# Patient Record
Sex: Female | Born: 1975
Health system: Southern US, Community
[De-identification: ages and names within clinical notes are randomized; demographics above are authoritative.]

## PROBLEM LIST (undated history)

## (undated) DIAGNOSIS — I10 Essential (primary) hypertension: Secondary | ICD-10-CM

## (undated) DIAGNOSIS — E669 Obesity, unspecified: Secondary | ICD-10-CM

## (undated) HISTORY — DX: Essential (primary) hypertension: I10

## (undated) HISTORY — PX: TONSILLECTOMY AND ADENOIDECTOMY: SHX28

## (undated) HISTORY — DX: Obesity, unspecified: E66.9

---
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
## 1999-07-18 ENCOUNTER — Other Ambulatory Visit: Admission: RE | Admit: 1999-07-18 | Discharge: 1999-07-18 | Payer: Self-pay | Admitting: Obstetrics and Gynecology

## 2000-06-10 ENCOUNTER — Other Ambulatory Visit: Admission: RE | Admit: 2000-06-10 | Discharge: 2000-06-10 | Payer: Self-pay | Admitting: Obstetrics and Gynecology

## 2001-06-19 ENCOUNTER — Other Ambulatory Visit: Admission: RE | Admit: 2001-06-19 | Discharge: 2001-06-19 | Payer: Self-pay | Admitting: Obstetrics and Gynecology

## 2002-06-21 ENCOUNTER — Other Ambulatory Visit: Admission: RE | Admit: 2002-06-21 | Discharge: 2002-06-21 | Payer: Self-pay | Admitting: Obstetrics and Gynecology

## 2003-06-02 ENCOUNTER — Encounter: Admission: RE | Admit: 2003-06-02 | Discharge: 2003-06-02 | Payer: Self-pay | Admitting: Family Medicine

## 2003-06-02 ENCOUNTER — Encounter: Payer: Self-pay | Admitting: Family Medicine

## 2003-07-12 ENCOUNTER — Other Ambulatory Visit: Admission: RE | Admit: 2003-07-12 | Discharge: 2003-07-12 | Payer: Self-pay | Admitting: Obstetrics and Gynecology

## 2004-07-20 ENCOUNTER — Other Ambulatory Visit: Admission: RE | Admit: 2004-07-20 | Discharge: 2004-07-20 | Payer: Self-pay | Admitting: Obstetrics and Gynecology

## 2005-07-22 ENCOUNTER — Other Ambulatory Visit: Admission: RE | Admit: 2005-07-22 | Discharge: 2005-07-22 | Payer: Self-pay | Admitting: Obstetrics and Gynecology

## 2006-07-22 ENCOUNTER — Other Ambulatory Visit: Admission: RE | Admit: 2006-07-22 | Discharge: 2006-07-22 | Payer: Self-pay | Admitting: Obstetrics and Gynecology

## 2006-10-29 ENCOUNTER — Ambulatory Visit (HOSPITAL_BASED_OUTPATIENT_CLINIC_OR_DEPARTMENT_OTHER): Admission: RE | Admit: 2006-10-29 | Discharge: 2006-10-29 | Payer: Self-pay | Admitting: Urology

## 2006-10-29 ENCOUNTER — Encounter (INDEPENDENT_AMBULATORY_CARE_PROVIDER_SITE_OTHER): Payer: Self-pay | Admitting: Specialist

## 2007-08-19 ENCOUNTER — Other Ambulatory Visit: Admission: RE | Admit: 2007-08-19 | Discharge: 2007-08-19 | Payer: Self-pay | Admitting: Obstetrics and Gynecology

## 2008-08-19 ENCOUNTER — Other Ambulatory Visit: Admission: RE | Admit: 2008-08-19 | Discharge: 2008-08-19 | Payer: Self-pay | Admitting: Obstetrics and Gynecology

## 2010-11-22 ENCOUNTER — Encounter
Admission: RE | Admit: 2010-11-22 | Discharge: 2010-12-04 | Payer: Self-pay | Source: Home / Self Care | Attending: Obstetrics and Gynecology | Admitting: Obstetrics and Gynecology

## 2011-01-16 ENCOUNTER — Encounter: Payer: BC Managed Care – PPO | Attending: Obstetrics and Gynecology | Admitting: *Deleted

## 2011-01-16 DIAGNOSIS — Z713 Dietary counseling and surveillance: Secondary | ICD-10-CM | POA: Insufficient documentation

## 2011-01-16 DIAGNOSIS — E669 Obesity, unspecified: Secondary | ICD-10-CM | POA: Insufficient documentation

## 2011-03-19 ENCOUNTER — Encounter: Payer: BC Managed Care – PPO | Attending: Obstetrics and Gynecology | Admitting: *Deleted

## 2011-03-19 DIAGNOSIS — E669 Obesity, unspecified: Secondary | ICD-10-CM | POA: Insufficient documentation

## 2011-03-19 DIAGNOSIS — Z713 Dietary counseling and surveillance: Secondary | ICD-10-CM | POA: Insufficient documentation

## 2011-03-22 NOTE — Op Note (Signed)
Kellie Frazier, Kellie Frazier                  ACCOUNT NO.:  [id number]   MEDICAL RECORD NO.:  [id number]          PATIENT TYPE:  AMB   LOCATION:  [facility]                         FACILITY:  [facility]   PHYSICIAN:  Ronald L. Earlene Plater, M.D.  DATE OF BIRTH:  1975/12/27   DATE OF PROCEDURE:  10/29/2006  DATE OF DISCHARGE:                               OPERATIVE REPORT   DIAGNOSIS:  Possible interstitial cystitis.   OPERATIVE PROCEDURES:  1. Cystourethroscopy.  2. Hydraulic bladder distention.  3. Bladder biopsy.   SURGEON:  Lucrezia Starch. Earlene Plater, M.D.   ANESTHESIA:  LMA.   ESTIMATED BLOOD LOSS:  Negligible.   TUBES:  None.   COMPLICATIONS:  None.   INDICATION FOR PROCEDURE:  Ms. [name] is a lovely 35 year old white  female approximately 3-1/2 months ago developed urgency, frequency and  some urge incontinence.  She notes she has had no neurologic symptoms at  all but notes that these symptoms have progressively worsened.  She does  have slight urinary continence, that is not the way, but mainly it is  the urgency, frequency and bladder pain relieved by voiding.  NMP-22 was  negative, and it is felt that interstitial cystitis is a possibility.  After understanding risks, benefits, and alternatives, she elects to  proceed with cystoscopy, hydraulic bladder distention and bladder biopsy  for diagnostic and therapeutic purposes.   PROCEDURE IN DETAIL:  The patient was placed in supine position, after  proper LMA anesthesia was placed in the dorsal lithotomy position and  prepped and draped with Betadine in sterile fashion.  Cystourethroscopy  was performed with a 22.5 [place] Olympus panendoscope.  The bladder was  carefully inspected and noted to be without lesions.  Efflux of clear  urine was noted from the normally-placed ureteral orifices bilaterally.  Hydraulic bladder distention was then performed to a pressure of 80  cmH2O.  She was noted to have an 1100 mL capacity bladder, and this  freely drained.   Reinspection revealed there were a few glomerulations  on the posterior wall but there were no  cracks or other problems.  A biopsy was obtained from the posterior  midline and submitted to pathology.  The base was cauterized with Bugbee  coagulation cautery.  The bladder was drained, good hemostasis noted to  be present, the endoscope was removed, and the patient was taken to the  recovery room stable.      Ronald L. Earlene Plater, M.D.  Electronically Signed     RLD/MEDQ  D:  10/29/2006  T:  10/29/2006  Job:  [id number]

## 2011-05-20 ENCOUNTER — Encounter: Payer: Self-pay | Admitting: *Deleted

## 2011-05-20 ENCOUNTER — Encounter: Payer: BC Managed Care – PPO | Attending: Obstetrics and Gynecology | Admitting: *Deleted

## 2011-05-20 DIAGNOSIS — E669 Obesity, unspecified: Secondary | ICD-10-CM | POA: Insufficient documentation

## 2011-05-20 DIAGNOSIS — Z713 Dietary counseling and surveillance: Secondary | ICD-10-CM | POA: Insufficient documentation

## 2011-05-20 NOTE — Progress Notes (Signed)
  Medical Nutrition Therapy:  Appt start time:  3:00p  end time:  3:30p.  ASSESSMENT Primary concerns today: Follow-up visit.   Patient doing well, but discouraged about ~4 lb weight gain. Clothes fitting only "a little loose". 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  Reports avoidance of [employer] and Starbucks since starting new job and exercises 2-3 times/week at the gym.  Recall shows irregular CHO intake pattern ranging from very high to very low (if any) CHO at meals and snacks.    MEDICATIONS: none  SUPPLEMENTS:  B12, Calcium, Vit D  DIETARY INTAKE 24-hr recall:  B (4:30 AM): cereal with skim milk or egg and cheese omlette, coffee  Snk (7:30-9:30 AM):  Nuts, water L (12:30 PM):  Out 2x/week or sandwich, nuts, water  Snk (time varies):  Red velvet cake batter yogurt w/ rainbow sprinkles (2x/week), water D (5:30-6 PM): Shrimp salad or shrimp and grits (parent's house)  Snk (8-9 PM): 8oz skim milk  Recent physical activity: Cardio and strength training at gym 2-3x/week  Estimated energy needs: (unchanged) 1500 calories 185-190 g carbohydrates (45g @ meals/15g @ snacks) 85-90 g protein 55-60 g fat  Progress Towards Goal(s):  Some progress.   Nutritional Diagnosis:  NI-5.8.4 Inconsistent carbohydrate intake related to highly varied CHO intake at meals and snacks as evidenced by 24-hour food recall and a 4.5 lb weight gain in 2 months.    Intervention/Updated Goals:  Make sure to get enough carbs and protein at each meal and snack.  Eat a snack after work and continue avoiding fast food restaurants.  Increase exercise to 3-4 days, try to include at least 20 min of cardio each time.  Watch portions at restaurants while on vacation; Choose healthier options.  Follow up with me in 4 weeks.   Monitoring/Evaluation:  Dietary intake, exercise, and body weight in 4  week(s).

## 2011-05-20 NOTE — Patient Instructions (Addendum)
Goals:  Make sure to get enough carbs and protein at each meal and snack.  Eat a snack after work and continue avoiding fast food restaurants.  Increase exercise to 3-4 days, try to include at least 20 min of cardio each time.  Watch portions at restaurants while on vacation; Choose healthier options.  Follow up with me in 4 weeks.

## 2011-06-17 ENCOUNTER — Encounter: Payer: BC Managed Care – PPO | Attending: Obstetrics and Gynecology | Admitting: *Deleted

## 2011-06-17 DIAGNOSIS — E669 Obesity, unspecified: Secondary | ICD-10-CM | POA: Insufficient documentation

## 2011-06-17 DIAGNOSIS — Z713 Dietary counseling and surveillance: Secondary | ICD-10-CM | POA: Insufficient documentation

## 2011-06-17 NOTE — Patient Instructions (Addendum)
Goals:  Continue previous nutrition goals.  Make sure to get enough carbs and protein at each meal and snack.  Eat a snack after work and continue avoiding fast food restaurants.  Resume previous exercise regimen 1-2 days/week - gradually increase to 3-4 days/week; include at least 20 min of cardio each time.  Follow up with me in 8 weeks.

## 2011-06-17 NOTE — Progress Notes (Signed)
  Medical Nutrition Therapy:  Appt start time: 1500 end time:  1530.  Assessment:  Primary concerns today: Obesity Follow up.  On vacation from 23-28th. Mostly cooking, little eating out. Ate chicken, spaghetti, eggs w/ bacon and biscuit (1-2 Grands brand), fruit, watermelon, pretzels. No desserts. Drank water or cheerwine (~1/day).  Also, hasn't exercised for last 3 weeks except lots of steps at the beach.   MEDICATIONS: No changes reported  DIETARY INTAKE  24-hr recall:  NO Starbucks since 05/25/11    B (4:30 AM): eggs or cereal w/ skim milk  Snk (8:30 AM):  8 oz co-worker's trail mix w/ peanuts, cashews, almonds, m&ms  L (12:30 PM): Flour tortilla (4") with [place] and cheese; water    Snk (varies): Rest of trail mix or Wendy's double stack (2) mini burgers (2x/week) or NONE D (5-6 PM): same type of dinners Snk (8-9 PM): 8 oz milk  Recent physical activity: None in last 3 weeks.  Estimated energy needs: (No changes) 1500 calories  185-190 g carbohydrates (45g @ meals/15g @ snacks)  85-90 g protein  55-60 g fat   Progress Towards Goal(s):  Some progress.   Nutritional Diagnosis:  NI-5.8.4 Inconsistent carbohydrate intake related to highly varied CHO intake at meals and snacks as evidenced by 24-hour food recall and a 4.5 lb weight gain in 2 months.    Intervention:   Continue previous nutrition goals. Make sure to get enough carbs and protein at each meal and snack.  Eat a snack after work and continue avoiding fast food restaurants.  Resume previous exercise regimen 1-2 days/week - gradually increase to 3-4 days/week; include at least 20 min of cardio each time.  Follow up with me in 4 weeks.   Monitoring/Evaluation:  Dietary intake, exercise, and body weight in 2 month(s).

## 2011-08-19 ENCOUNTER — Encounter: Payer: 59 | Attending: Obstetrics and Gynecology | Admitting: *Deleted

## 2011-08-19 ENCOUNTER — Encounter: Payer: Self-pay | Admitting: *Deleted

## 2011-08-19 DIAGNOSIS — Z713 Dietary counseling and surveillance: Secondary | ICD-10-CM | POA: Insufficient documentation

## 2011-08-19 DIAGNOSIS — E669 Obesity, unspecified: Secondary | ICD-10-CM | POA: Insufficient documentation

## 2011-08-19 NOTE — Progress Notes (Signed)
  Medical Nutrition Therapy:  Appt start time: 1500 end time:  1530.  Assessment:  Primary concerns today: Obesity Follow up.  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  Reports eating out a lot more; "I'm letting stress dictate what I eat".  Increased emotional and stress eating. Thinking about starting the "Fat Smash" - 9 days: detox - unltd veggies/fruit, no red meat, herbal tea.   MEDICATIONS: No changes reported  24-hr recall:  Back on starbucks - cinnamon dolce latte; but venti with reg milk and whip cream    B (4:30 AM): eggs or cereal w/ skim milk  Snk (8:30 AM):  8 oz trail mix w/ peanuts, cashews, almonds, m&ms  L (12:30 PM): Subway - 12" tuscan chicken melt plain w/ cheese and non-starchy veggies; Sprite 16 oz or water   Snk: None D (5-6 PM): at parent's house - getting seconds Snk (8-9 PM): 8 oz glass milk  Recent physical activity: Gym 2-3 days/wk - inconsistent lately.  Estimated energy needs: (No changes) 1500 calories  185-190 g carbohydrates (45g @ meals/15g @ snacks)  85-90 g protein  55-60 g fat  Progress Towards Goal(s):  No progress.   Nutritional Diagnosis:  NI-5.8.4 Inconsistent carbohydrate intake related to highly varied CHO intake at meals and snacks as evidenced by 24-hour food recall and a 4.5 lb weight gain in 2 months.    Intervention:   Continue previous nutrition goals. Add daily MVI - try the gummies.  Continue to aim for 3 days/week at the gym for 30-60 min each visit. If you try the Fat Smash diet, keep in contact with me so I can monitor.  Call or email me with ANY questions or while at the pizza parlor.    Monitoring/Evaluation:  Dietary intake, exercise, and body weight in 2 month(s).

## 2011-08-19 NOTE — Patient Instructions (Addendum)
Goals:  Continue previous nutrition goals. Add daily MVI - try the gummies.  Continue to aim for 3 days/week at the gym for 30-60 min each visit. If you try the Fat Smash diet, keep in contact with me so I can monitor.  Call or email me with ANY questions or while at the pizza parlor.

## 2011-10-14 ENCOUNTER — Ambulatory Visit: Payer: BC Managed Care – PPO | Admitting: *Deleted

## 2011-10-22 ENCOUNTER — Encounter: Payer: Self-pay | Admitting: *Deleted

## 2011-10-22 ENCOUNTER — Encounter: Payer: 59 | Attending: Obstetrics and Gynecology | Admitting: *Deleted

## 2011-10-22 DIAGNOSIS — Z713 Dietary counseling and surveillance: Secondary | ICD-10-CM | POA: Insufficient documentation

## 2011-10-22 DIAGNOSIS — E669 Obesity, unspecified: Secondary | ICD-10-CM | POA: Insufficient documentation

## 2011-10-22 NOTE — Progress Notes (Signed)
  Medical Nutrition Therapy:  Appt start time: 1545 end time:  1615.  Assessment:  Primary concerns today: Obesity Follow up.  Pt doing much better now that school is done for the semester (12/5). Never started the Fat Smash diet d/t time constraints.  Since 12/5, eating a little better, but still eating convenience foods d/t lack of finances. Sticking with what works.    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  Reports eating out a lot more; "I'm letting stress dictate what I eat".  Increased emotional and stress eating. Thinking about starting the "Fat Smash" - 9 days: detox - unltd veggies/fruit, no red meat, herbal tea.   MEDICATIONS: No changes reported  24-hr recall:  Only drinking caramel brulee latte every other weekend; venti with reg milk and whip cream    B (4:30 AM):  Cereal in a bag  Snk (8:30 AM):  none  L (12:30 PM): [place] on sandwich round; water   Snk: None D (5-6 PM): 1 helping of each food at parent's house - no seconds now Snk (8-9 PM): none *Drinks ~ 85-90 oz water daily.  Recent physical activity: None last month d/t end of semester/work responsibilities.  Plans to resume gym 2-3x/wk asap.   Estimated energy needs: (No changes) 1500 calories  185-190 g carbohydrates (45g @ meals/15g @ snacks)  85-90 g protein  55-60 g fat  Progress Towards Goal(s):  No progress.   Nutritional Diagnosis:  NI-5.8.4 Inconsistent carbohydrate intake related to highly varied CHO intake at meals and snacks as evidenced by 24-hour food recall and a 4.5 lb weight gain in 2 months.    Intervention:   Continue previous nutrition goals.  Continue to aim for 3 days/week at the gym for 30-60 min each visit. When at [facility], choose smaller size and try with skim milk.   Monitoring/Evaluation:  Dietary intake, exercise, and body weight in 2 month(s).

## 2011-10-22 NOTE — Patient Instructions (Addendum)
Goals:  Continue previous nutrition goals.  Continue to aim for 3 days/week at the gym for 30-60 min each visit. When at [facility], choose smaller size and try with skim milk.   Estimated energy needs:  1500 calories  185-190 g carbohydrates (45g @ meals/15g @ snacks)  85-90 g protein  55-60 g fat

## 2011-10-23 ENCOUNTER — Encounter: Payer: Self-pay | Admitting: *Deleted

## 2011-12-03 ENCOUNTER — Encounter: Payer: 59 | Attending: Obstetrics and Gynecology | Admitting: *Deleted

## 2011-12-03 ENCOUNTER — Encounter: Payer: Self-pay | Admitting: *Deleted

## 2011-12-03 DIAGNOSIS — Z713 Dietary counseling and surveillance: Secondary | ICD-10-CM | POA: Insufficient documentation

## 2011-12-03 DIAGNOSIS — E669 Obesity, unspecified: Secondary | ICD-10-CM | POA: Insufficient documentation

## 2011-12-03 NOTE — Progress Notes (Signed)
  Medical Nutrition Therapy:  Appt start time: 1545 end time:  1615.  Primary concerns today: Obesity Follow up.  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 No significant changes in dietary habits, though now only drinks grande cinnamon latte every (or every other) weekend w/ reg milk and whip cream plus 1 daily snack. Continues to consume excessive CHO at times. Pt has resumed exercising 2 days/week and plans to increase. No other changes noted. Will track body composition each visit.  TANITA BODY COMPOSITION RESULTS: %Fat:  47.5% FM:  113.5 lbs FFM:  125.0 lbs TBW:  91.5 lbs (on menstrual cycle)  MEDICATIONS: No changes reported  24-hr recall:  Drinks cinnamon latte every (or other) weekend; [place] now (was [employer]) w/ reg milk and whip cream   B (4:30 AM):  2 scrambled eggs, whole wheat bagel (med) w/ cream cheese; water Snk (8:30 AM):  Snackwells yogurt covered pretzels 100 cal pack or Zone perfect strawberry bar; water L (12:30 PM): [place] on sandwich round; water   Snk: None D (5-6 PM): Chili (homemade) w/ sour cream, Fritos scoops; water Snk (8-9 PM): none *Drinks ~ 85-90 oz water daily.  Recent physical activity: Doing elliptical or treadmill 2x/wk @ 20-30 min; recently added weights.    Estimated energy needs: (No changes) 1500 calories  185-190 g carbohydrates (45g @ meals/15g @ snacks)  85-90 g protein  55-60 g fat  Progress Towards Goal(s):  Some progress; Continue.   Nutritional Diagnosis:  NI-5.8.4 Inconsistent carbohydrate intake related to highly varied CHO intake at meals and snacks as evidenced by 24-hour food recall and a 4.5 lb weight gain in 2 months.    Intervention:   Continue previous nutrition goals.  Continue to aim for 3-4 days/week at the gym for 30-60 min each visit. When at [facility], choose smaller size and try with skim milk.   Monitoring/Evaluation:  Dietary intake, exercise, and body weight in 6 weeks(s).

## 2011-12-03 NOTE — Patient Instructions (Signed)
Goals:  Continue previous nutrition goals.  Continue to aim for 3-4 days/week at the gym for 30-60 min each visit. When at [facility], choose smaller size and try with skim milk.   Estimated energy needs:  1500 calories  185-190 g carbohydrates (45g @ meals/15g @ snacks)  85-90 g protein  55-60 g fat

## 2012-01-14 ENCOUNTER — Encounter: Payer: 59 | Attending: Obstetrics and Gynecology | Admitting: *Deleted

## 2012-01-14 ENCOUNTER — Encounter: Payer: Self-pay | Admitting: *Deleted

## 2012-01-14 DIAGNOSIS — Z713 Dietary counseling and surveillance: Secondary | ICD-10-CM | POA: Insufficient documentation

## 2012-01-14 DIAGNOSIS — E669 Obesity, unspecified: Secondary | ICD-10-CM | POA: Insufficient documentation

## 2012-01-14 NOTE — Patient Instructions (Addendum)
Goals:  Continue previous nutrition goals.  Resume physical activity. Work up to 3-4 days/week at the gym for 30-60 min each visit.   - Have small snack before exercising (or try Calorie Smart - 1/2 before and 1/2 after)  Continue to avoid/limit sweets and sweetened drinks  TANITA  BODY COMP RESULTS  12/03/11 01/14/12  %Fat 47.5% 46.6%  FM (lbs) 113.5 111.0  FFM (lbs) 125.0 127.0  TBW (lbs) 91.5 93.0   Estimated energy needs:  1500 calories  185-190 g carbohydrates (45g @ meals/15g @ snacks)  85-90 g protein  55-60 g fat

## 2012-01-14 NOTE — Progress Notes (Signed)
  Medical Nutrition Therapy:  Appt start time: 1515 end time:  1545.  Primary concerns today: Obesity, Follow up.  Reports nausea with sweets recently, so stopped 1.5 weeks ago. Reports watching herself on a camera at work and "not liking it at all".  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  Has lost ~1% body fat and 2.5 lbs FM; has gained 2 lbs FFM.   TANITA  BODY COMP RESULTS  12/03/11 01/14/12    %Fat 47.5% 46.6%    FM (lbs) 113.5 111.0    FFM (lbs) 125.0 127.0    TBW (lbs) 91.5 93.0     MEDICATIONS: No changes reported  Recent physical activity: Backed off for several weeks d/t studying for licensing exam. Slowly resuming; not much at this point.    Estimated energy needs: (No changes) 1500 calories  185-190 g carbohydrates (45g @ meals/15g @ snacks)  85-90 g protein  55-60 g fat  Progress Towards Goal(s):  Some progress; Continue.   Nutritional Diagnosis:  NI-5.8.4 Inconsistent carbohydrate intake related to highly varied CHO intake at meals and snacks as evidenced by 24-hour food recall and a 4.5 lb weight gain in 2 months.    Intervention:   Continue previous nutrition goals.  Resume physical activity. Work up to 3-4 days/week at the gym for 30-60 min each visit.   - Have small snack before exercising (or try Calorie Smart - 1/2 before and 1/2 after)  Continue to avoid/limit sweets and sweetened drinks.   Monitoring/Evaluation:  Dietary intake, exercise, and body weight in 6 weeks(s).  Samples Dispersed:   Boost Glucose Control: 3 bottles Lot #: [id number]; Exp: 10/05/12

## 2012-02-26 ENCOUNTER — Encounter: Payer: Self-pay | Admitting: *Deleted

## 2012-02-26 ENCOUNTER — Encounter: Payer: 59 | Attending: Obstetrics and Gynecology | Admitting: *Deleted

## 2012-02-26 ENCOUNTER — Ambulatory Visit: Payer: 59 | Admitting: *Deleted

## 2012-02-26 DIAGNOSIS — E669 Obesity, unspecified: Secondary | ICD-10-CM | POA: Insufficient documentation

## 2012-02-26 DIAGNOSIS — Z713 Dietary counseling and surveillance: Secondary | ICD-10-CM | POA: Insufficient documentation

## 2012-02-26 NOTE — Progress Notes (Signed)
  Medical Nutrition Therapy:  Appt start time: 800   End time:  830.  Primary concerns today: Obesity, Follow up.  Kellie Frazier is here today for f/u with a weight loss of 3 lbs. Reports increase in exercise. Plans to do yoga class this weekend and wants to do water aerobics. Still consuming excessive CHO at times, though continues to stay away from sweets and now fast food biscuits. No pain reported outside of soreness from Monday workout.  TANITA  BODY COMP RESULTS  12/03/11 01/14/12 02/26/12   %Fat 47.5% 46.6% 46.7%   FM (lbs) 113.5 111.0 110.5   FFM (lbs) 125.0 127.0 126.0   TBW (lbs) 91.5 93.0 92.0    MEDICATIONS: No changes reported  Recent physical activity: Joined the [employer] on 02/24/12 and working with a Systems analyst. Plans to go 3 days a week and gradually increase.   24-Hour Dietary Recall B: Boost Glucose Control OR eggs, 3 pcs [place] bacon, waffle w/ syrup  S: None OR peanut butter crackers L: Dinner leftovers S: None D: Chicken, blk beans, corn, rice casserole (2 c)  Estimated energy needs: (No changes) 1500 calories  185-190 g carbohydrates (45g @ meals/15g @ snacks)  85-90 g protein  55-60 g fat  Progress Towards Goal(s):  Some progress; Continue.   Nutritional Diagnosis:  NI-5.8.4 Inconsistent carbohydrate intake related to highly varied CHO intake at meals and snacks as evidenced by 24-hour food recall and a 4.5 lb weight gain in 2 months.    Intervention:   Continue previous nutrition goals.  Resume physical activity. Work up to 3-4 days/week at the gym for 30-60 min each visit.   - Have small snack before exercising (or try Calorie Smart - 1/2 before and 1/2 after)  Continue to avoid/limit sweets and sweetened drinks.   Samples Dispensed:   Celebrate B12: 1 ea  Kellie Frazier - Lot # [id number]; Exp: 07/14)  Bariatric Advantage B12: 2 ea  (Peppermint - Lot # [id number]; Exp: 05/13)  Monitoring/Evaluation:  Dietary intake, exercise, and body weight in 6  weeks(s).

## 2012-02-26 NOTE — Patient Instructions (Addendum)
Goals:  Continue previous nutrition goals.  Resume physical activity. Work up to 3-4 days/week at the gym for 30-60 min each visit.   - Have small snack before exercising (or try Calorie Smart - 1/2 before and 1/2 after)  Continue to avoid/limit sweets and sweetened drinks   TANITA  BODY COMP RESULTS   12/03/11 01/14/12 02/26/12  %Fat  47.5%  46.6%  46.7%  FM (lbs) 113.5  111.0  110.5  FFM (lbs) 125.0  127.0  126.0  TBW (lbs) 91.5  93.0  92.0   Estimated energy needs:  1500 calories  185-190 g carbohydrates (45g @ meals/15g @ snacks)  85-90 g protein  55-60 g fat

## 2012-04-08 ENCOUNTER — Encounter: Payer: 59 | Attending: Obstetrics and Gynecology | Admitting: *Deleted

## 2012-04-08 ENCOUNTER — Encounter: Payer: Self-pay | Admitting: *Deleted

## 2012-04-08 DIAGNOSIS — E669 Obesity, unspecified: Secondary | ICD-10-CM | POA: Insufficient documentation

## 2012-04-08 DIAGNOSIS — Z713 Dietary counseling and surveillance: Secondary | ICD-10-CM | POA: Insufficient documentation

## 2012-04-08 NOTE — Progress Notes (Addendum)
  Medical Nutrition Therapy:  Appt start time: 1500   End time:  1530.  Primary concerns today: Obesity, Follow-up.  Highly stressful at work and still in school (ends 10/2012). Reports less exercise lately d/t to this. Has been going to the gym inconsistently - based on the day. Has returned to fast food, but continues to stay away from concentrated sweets (except soda).   TANITA  BODY COMP RESULTS  12/03/11 01/14/12 02/26/12 04/08/12  FM (lbs) 113.5 111.0 110.5 110.5  FFM (lbs) 125.0 127.0 126.0 123.0  TBW (lbs) 91.5 93.0 92.0 90.0   MEDICATIONS: No changes reported  Recent physical activity:  GYM = Kickboxing for 60 min; Averages 2 times/week. Plans to attend more classes as they were recently offered.   24-Hour Dietary Recall B: Boost Glucose Control, Frosted Mini Wheats w/ blueberries (no milk)  S: Continued cereal L: Cheese Pizza @ work (3 pcs) OR Spicy Chicken Sandwich (Wendy's or Chick-fil-A), med waffle fries, Cheerwine or Sprite (med- 24 oz) -- DOES THIS 2-3 TIMES/WEEK S: None D: Pork tenderloin, steamed veggies, 3/4 of a Grands biscuit S: Sometimes has milk before bed  Estimated energy needs: (No changes) 1500 calories  185-190 g carbohydrates (45g @ meals/15g @ snacks)  85-90 g protein  55-60 g fat  Progress Towards Goal(s):  Some progress; Continue.   Nutritional Diagnosis:  NI-5.8.4 Inconsistent carbohydrate intake related to highly varied CHO intake at meals and snacks as evidenced by 24-hour food recall and a 4.5 lb weight gain in 2 months.    Intervention:  Continue previous nutrition goals.  Resume physical activity. Work up to 3-4 days/week at the gym for 30-60 min each visit.   - Try Unjury chocolate protein powder as a drink Avoid/limit sweets and sweetened drinks STOP EATING FAST FOOD and TAKE YOUR LUNCH TO WORK! :)  Samples dispensed at visit include:   Unjury Protein Powder (Choc Splendor): 2 pkts Lot # [id number]; Exp: 08/14  Monitoring/Evaluation:   Dietary intake, exercise, and body weight in 6 weeks(s).

## 2012-04-08 NOTE — Patient Instructions (Addendum)
Goals:  Continue previous nutrition goals.  Resume physical activity. Work up to 3-4 days/week at the gym for 30-60 min each visit.   - Try Unjury chocolate protein powder as a drink Avoid/limit sweets and sweetened drinks STOP EATING FAST FOOD and TAKE YOUR LUNCH TO WORK! :)

## 2012-05-20 ENCOUNTER — Ambulatory Visit: Payer: 59 | Admitting: *Deleted

## 2012-09-18 LAB — HM PAP SMEAR: HM Pap smear: NEGATIVE

## 2013-09-24 ENCOUNTER — Encounter: Payer: Self-pay | Admitting: Obstetrics and Gynecology

## 2013-09-24 ENCOUNTER — Ambulatory Visit: Payer: Self-pay | Admitting: Obstetrics and Gynecology

## 2013-09-24 ENCOUNTER — Ambulatory Visit (INDEPENDENT_AMBULATORY_CARE_PROVIDER_SITE_OTHER): Payer: BC Managed Care – PPO | Admitting: Obstetrics and Gynecology

## 2013-09-24 VITALS — BP 142/90 | HR 78 | Resp 16 | Ht 65.5 in | Wt 239.0 lb

## 2013-09-24 DIAGNOSIS — Z01419 Encounter for gynecological examination (general) (routine) without abnormal findings: Secondary | ICD-10-CM

## 2013-09-24 DIAGNOSIS — Z0000 Encounter for general adult medical examination without abnormal findings: Secondary | ICD-10-CM

## 2013-09-24 LAB — POCT URINALYSIS DIPSTICK
Bilirubin, UA: NEGATIVE
Blood, UA: NEGATIVE
Glucose, UA: NEGATIVE
Ketones, UA: NEGATIVE
Leukocytes, UA: NEGATIVE
Nitrite, UA: NEGATIVE
Protein, UA: NEGATIVE
Urobilinogen, UA: NEGATIVE
pH, UA: 6

## 2013-09-24 NOTE — Progress Notes (Signed)
GYNECOLOGY VISIT  PCP: Dr. Barton Fanny  Referring provider:   HPI: 37 y.o.   Single Caucasian  female   [id number] with Patient's last menstrual period was 09/01/2013.   here for   Annual Exam Menses regular. No heavy bleeding. Uses Aleve for cramping and pressure.  No signficant PMS.  Mother diagnosed with breast cancer in her mid 14s.  Doing well.   Hgb:  PCP Urine:  Neg  GYNECOLOGIC HISTORY: Patient's last menstrual period was 09/01/2013. Sexually active:  no Partner preference: female Contraception:   abstain Menopausal hormone therapy: no DES exposure:  no  Blood transfusions:   no Sexually transmitted diseases:  no  GYN Procedures:  no Mammogram:   never    .          Pap: 09/18/12 neg, HR HPV negative   History of abnormal pap smear:  no   OB History   Grav Para Term Preterm Abortions TAB SAB Ect Mult Living   0 0 0 0 0 0 0 0 0 0        LIFESTYLE: Exercise:   Yes  Cardio at [facility] Once a week            Tobacco: no Alcohol: no Drug use:  no  OTHER HEALTH MAINTENANCE: Tetanus/TDap: 2013 Gardisil: completed in 2007 Influenza:  yes Zostavax: no  Bone density: no,  Heel scan  Yes (normal) Colonoscopy:no  Cholesterol check: PCP  Family History  Problem Relation Age of Onset  . Diabetes Other   . Hypertension Other   . Hypertension Maternal Grandmother   . Hypertension Maternal Grandfather   . Diabetes Paternal Grandmother   . Hypertension Paternal Grandmother   . Diabetes Paternal Grandfather   . Hypertension Paternal Grandfather   . Hypertension Mother   . Breast cancer Mother     There are no active problems to display for this patient.  Past Medical History  Diagnosis Date  . Obesity (BMI 30-39.9)   . Hypertension     Past Surgical History  Procedure Laterality Date  . Tonsillectomy and adenoidectomy    . Dysplastic mole removal  1998    --arm and leg    ALLERGIES: Amoxicillin and Erythromycin  Current Outpatient Prescriptions   Medication Sig Dispense Refill  . hydrochlorothiazide (MICROZIDE) 12.5 MG capsule Take 12.5 mg by mouth daily.      Marland Kitchen CALCIUM PO Take by mouth daily. Pt not sure of dose.       . Cholecalciferol (VITAMIN D) 1000 UNITS capsule Take 1,000 Units by mouth daily.        . Multiple Vitamin (MULTIVITAMINS PO) Take 2 each by mouth daily.        . vitamin B-12 (CYANOCOBALAMIN) 500 MCG tablet Take 500 mcg by mouth daily.         No current facility-administered medications for this visit.     ROS:  Pertinent items are noted in HPI.  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  PHYSICAL EXAMINATION:    BP 142/90  Pulse 78  Resp 16  Ht 5' 5.5" (1.664 m)  Wt 239 lb (108.41 kg)  BMI 39.15 kg/m2  LMP 09/01/2013   Wt Readings from Last 3 Encounters:  09/24/13 239 lb (108.41 kg)  04/08/12 233 lb 8 oz (105.915 kg)  02/26/12 236 lb 8 oz (107.276 kg)     Ht Readings from Last 3 Encounters:  09/24/13 5' 5.5" (1.664 m)  04/08/12 5' 5.5" (1.664 m)  02/26/12  5' 5.5" (1.664 m)    General appearance: alert, cooperative and appears stated age Head: Normocephalic, without obvious abnormality, atraumatic Neck: no adenopathy, supple, symmetrical, trachea midline and thyroid not enlarged, symmetric, no tenderness/mass/nodules Lungs: clear to auscultation bilaterally Breasts: Inspection negative, No nipple retraction or dimpling, No nipple discharge or bleeding, No axillary or supraclavicular adenopathy, Normal to palpation without dominant masses Heart: regular rate and rhythm Abdomen: obese, soft, non-tender; no masses,  no organomegaly Extremities: extremities normal, atraumatic, no cyanosis or edema Skin: Skin color, texture, turgor normal. No rashes or lesions Lymph nodes: Cervical, supraclavicular, and axillary nodes normal. No abnormal inguinal nodes palpated Neurologic: Grossly normal  Pelvic: External genitalia:  no lesions              Urethra:  normal appearing urethra  with no masses, tenderness or lesions              Bartholins and Skenes: normal                 Vagina: normal appearing vagina with normal color and discharge, no lesions              Cervix: normal appearance              Pap and high risk HPV testing done: no.            Bimanual Exam:  Uterus:  uterus is normal size, shape, consistency and nontender                                      Adnexa: normal adnexa in size, nontender and no masses                                      Rectovaginal: Confirms                                      Anus:  normal sphincter tone, no lesions  ASSESSMENT  Normal gynecologic exam.  PLAN  Mammogram age 75.  Pap smear and high risk HPV testing in 2018. Return annually or prn   An After Visit Summary was printed and given to the patient.

## 2013-09-24 NOTE — Patient Instructions (Signed)

## 2014-06-30 ENCOUNTER — Encounter: Payer: Self-pay | Admitting: Obstetrics and Gynecology

## 2014-10-03 ENCOUNTER — Ambulatory Visit: Payer: BC Managed Care – PPO | Admitting: Obstetrics and Gynecology

## 2014-10-05 ENCOUNTER — Ambulatory Visit: Payer: BC Managed Care – PPO | Admitting: Obstetrics and Gynecology

## 2014-10-17 ENCOUNTER — Telehealth: Payer: Self-pay | Admitting: Obstetrics and Gynecology

## 2014-10-17 NOTE — Telephone Encounter (Signed)
Left message to call back regarding records release. All for fee or last 2 visits for free?

## 2014-10-18 NOTE — Telephone Encounter (Signed)
Most recent visits faxed per pt request.

## 2014-10-18 NOTE — Telephone Encounter (Signed)
Pt agrees to last two visits.  bf

## 2016-10-14 ENCOUNTER — Other Ambulatory Visit: Payer: Self-pay | Admitting: Obstetrics & Gynecology

## 2016-10-14 ENCOUNTER — Other Ambulatory Visit (HOSPITAL_COMMUNITY)
Admission: RE | Admit: 2016-10-14 | Discharge: 2016-10-14 | Disposition: A | Discharge status: Home / Self Care | Payer: 59 | Source: Ambulatory Visit | Attending: Obstetrics & Gynecology | Admitting: Obstetrics & Gynecology

## 2016-10-14 DIAGNOSIS — Z01419 Encounter for gynecological examination (general) (routine) without abnormal findings: Secondary | ICD-10-CM | POA: Insufficient documentation

## 2016-10-14 DIAGNOSIS — Z1151 Encounter for screening for human papillomavirus (HPV): Secondary | ICD-10-CM | POA: Insufficient documentation

## 2016-10-17 LAB — CYTOLOGY - PAP
Diagnosis: NEGATIVE
HPV: NOT DETECTED

## 2018-10-19 ENCOUNTER — Other Ambulatory Visit: Payer: Self-pay | Admitting: Obstetrics & Gynecology

## 2018-10-19 DIAGNOSIS — Z1231 Encounter for screening mammogram for malignant neoplasm of breast: Secondary | ICD-10-CM

## 2018-10-26 ENCOUNTER — Ambulatory Visit: Payer: 59

## 2018-11-26 ENCOUNTER — Ambulatory Visit
Admission: RE | Admit: 2018-11-26 | Discharge: 2018-11-26 | Disposition: A | Discharge status: Home / Self Care | Payer: BLUE CROSS/BLUE SHIELD | Source: Ambulatory Visit | Attending: Obstetrics & Gynecology | Admitting: Obstetrics & Gynecology

## 2018-11-26 DIAGNOSIS — Z1231 Encounter for screening mammogram for malignant neoplasm of breast: Secondary | ICD-10-CM

## 2018-11-27 ENCOUNTER — Other Ambulatory Visit: Payer: Self-pay | Admitting: Obstetrics & Gynecology

## 2018-11-27 DIAGNOSIS — R928 Other abnormal and inconclusive findings on diagnostic imaging of breast: Secondary | ICD-10-CM

## 2018-12-04 ENCOUNTER — Ambulatory Visit
Admission: RE | Admit: 2018-12-04 | Discharge: 2018-12-04 | Disposition: A | Discharge status: Home / Self Care | Payer: BLUE CROSS/BLUE SHIELD | Source: Ambulatory Visit | Attending: Obstetrics & Gynecology | Admitting: Obstetrics & Gynecology

## 2018-12-04 ENCOUNTER — Other Ambulatory Visit: Payer: Self-pay | Admitting: Obstetrics & Gynecology

## 2018-12-04 DIAGNOSIS — R928 Other abnormal and inconclusive findings on diagnostic imaging of breast: Secondary | ICD-10-CM

## 2018-12-04 DIAGNOSIS — N6489 Other specified disorders of breast: Secondary | ICD-10-CM

## 2019-06-07 ENCOUNTER — Other Ambulatory Visit: Payer: Self-pay | Admitting: Obstetrics & Gynecology

## 2019-06-07 ENCOUNTER — Other Ambulatory Visit: Payer: Self-pay

## 2019-06-07 ENCOUNTER — Ambulatory Visit
Admission: RE | Admit: 2019-06-07 | Discharge: 2019-06-07 | Disposition: A | Discharge status: Home / Self Care | Payer: BC Managed Care – PPO | Source: Ambulatory Visit | Attending: Obstetrics & Gynecology | Admitting: Obstetrics & Gynecology

## 2019-06-07 DIAGNOSIS — N6489 Other specified disorders of breast: Secondary | ICD-10-CM

## 2019-12-10 ENCOUNTER — Other Ambulatory Visit: Payer: Self-pay

## 2019-12-10 ENCOUNTER — Ambulatory Visit: Payer: BC Managed Care – PPO

## 2019-12-10 ENCOUNTER — Ambulatory Visit
Admission: RE | Admit: 2019-12-10 | Discharge: 2019-12-10 | Disposition: A | Discharge status: Home / Self Care | Payer: Managed Care, Other (non HMO) | Source: Ambulatory Visit | Attending: Obstetrics & Gynecology | Admitting: Obstetrics & Gynecology

## 2019-12-10 DIAGNOSIS — N6489 Other specified disorders of breast: Secondary | ICD-10-CM

## 2021-06-14 IMAGING — MG DIGITAL DIAGNOSTIC BILAT W/ TOMO W/ CAD
8 series · 8 of 24 positions shown · non-contrast
Comparison: Previous exam(s).

CLINICAL DATA: 43-year-old female presenting for follow-up of a
likely benign left breast asymmetry.

EXAM:
DIGITAL DIAGNOSTIC BILATERAL MAMMOGRAM WITH CAD AND TOMO

[L MLO synth-2D]
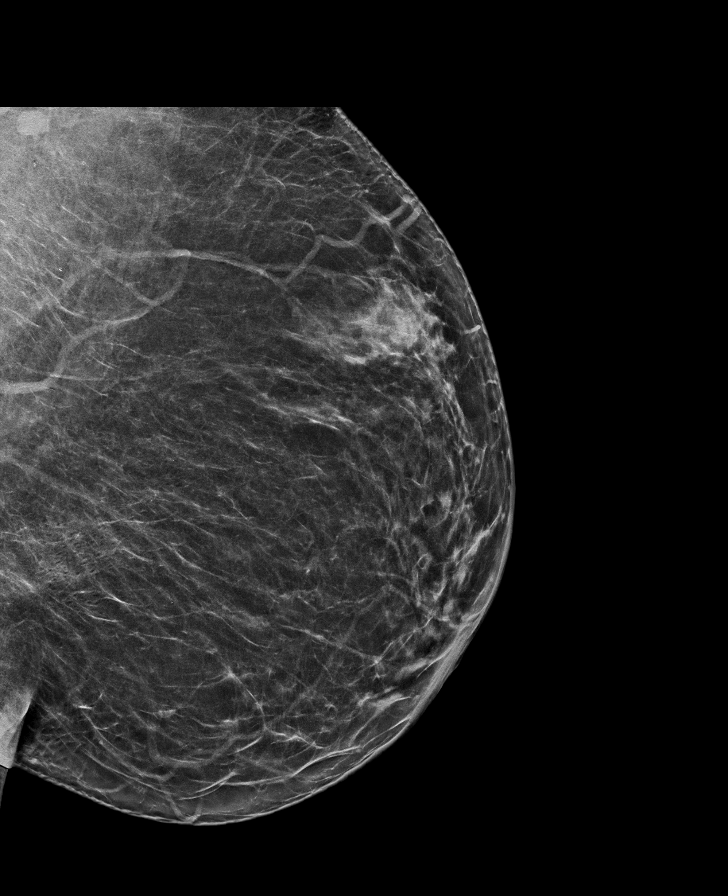

[R MLO synth-2D]
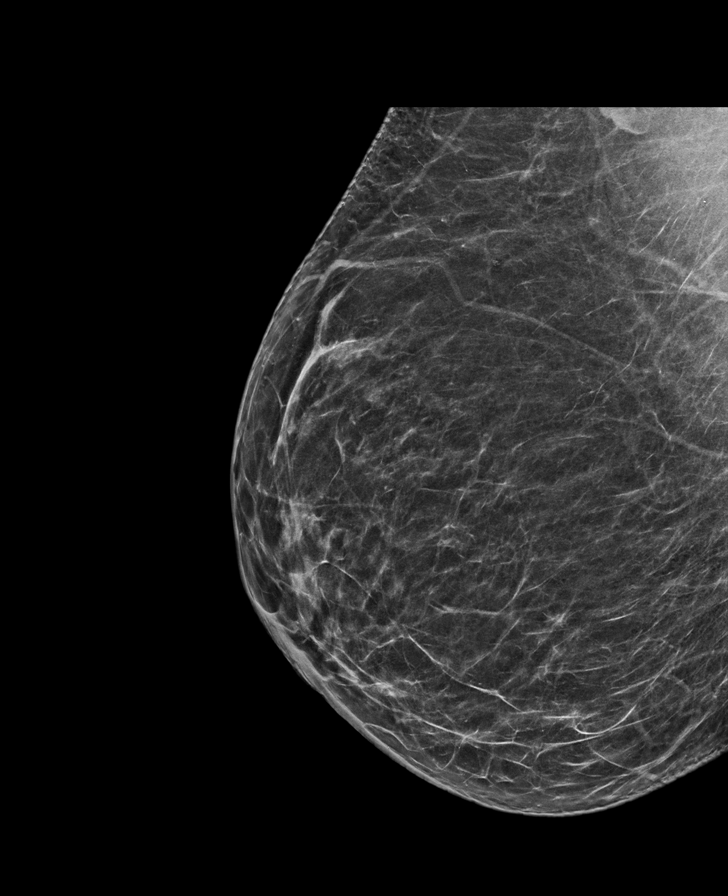

[L CC synth-2D]
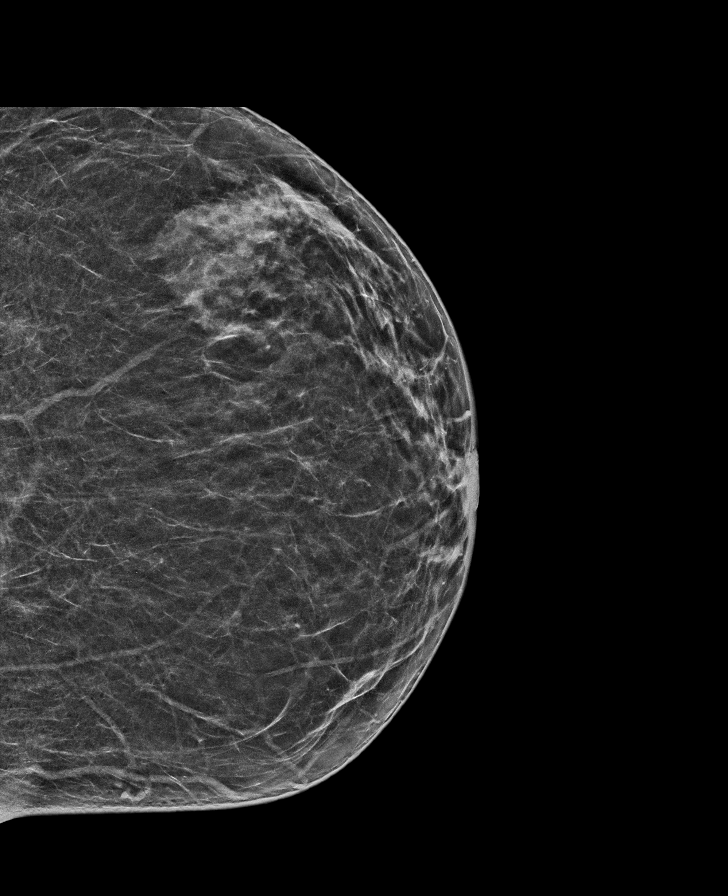

[R CC synth-2D]
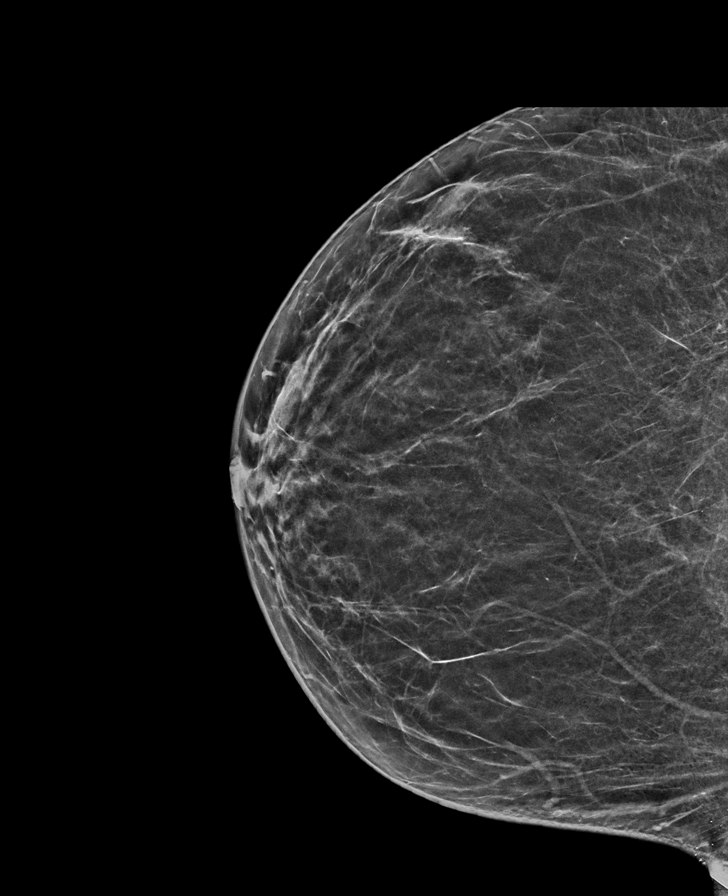

[R MLO tomo · tomo slice 35/70.0]
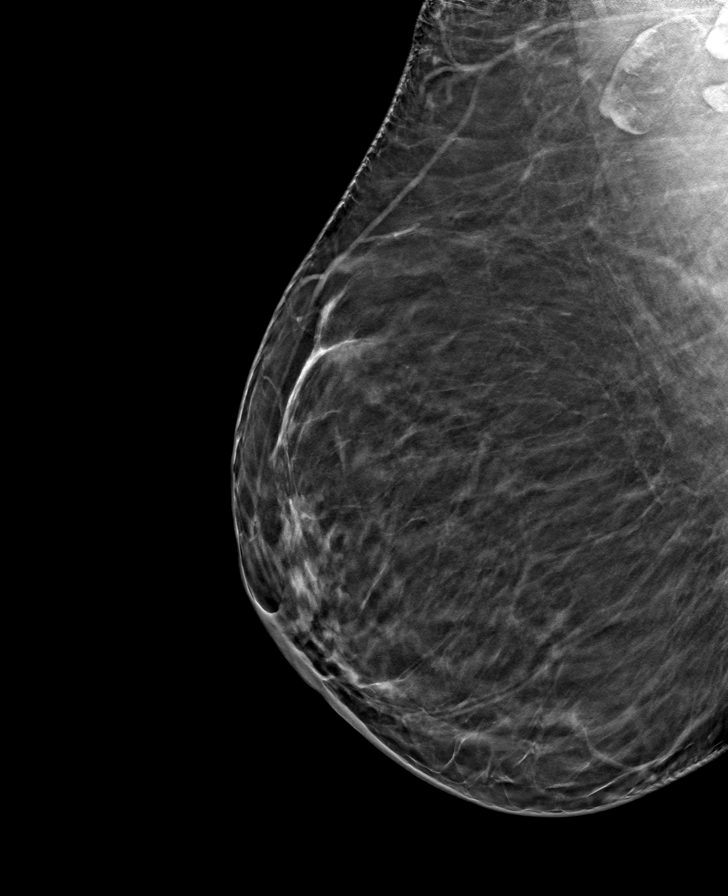

[L MLO tomo · tomo slice 35/69.0]
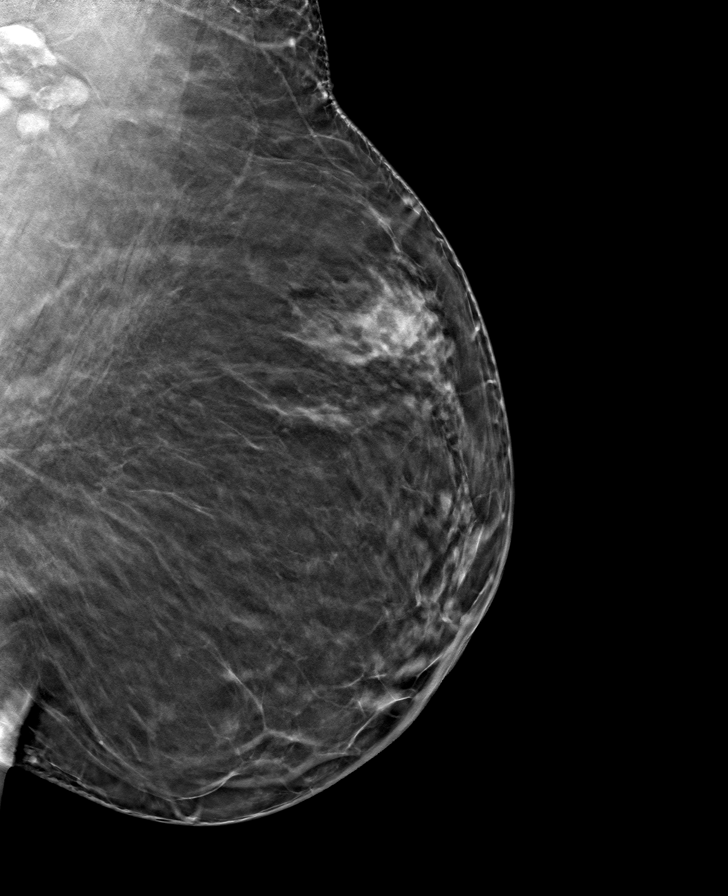

[R CC tomo · tomo slice 31/60.0]
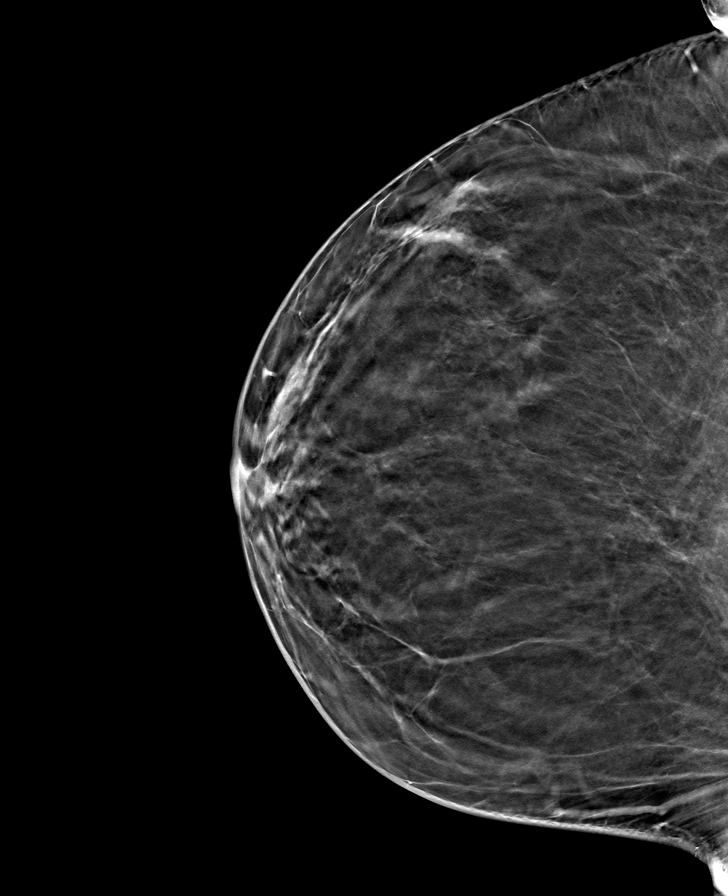

[L CC tomo · tomo slice 30/59.0]
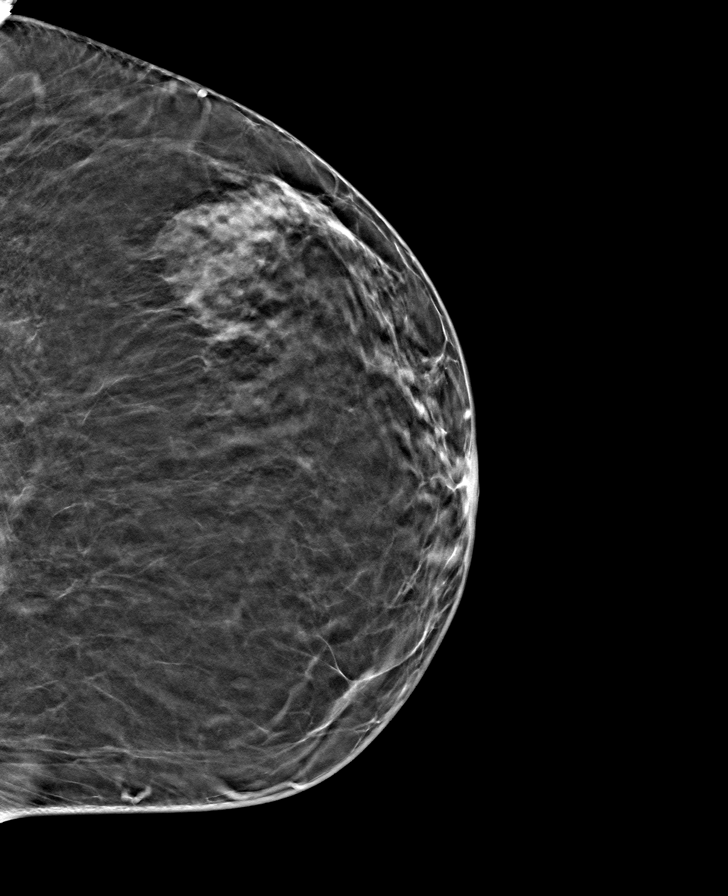

[8 of 24 positions shown; findings below may reference images not displayed]

ACR Breast Density Category b: There are scattered areas of
fibroglandular density.
FINDINGS: The focal asymmetry in the upper-outer quadrant of the left breast
is mammographically stable. No suspicious calcifications, masses or
areas of distortion are seen in the bilateral breasts.

Mammographic images were processed with CAD.
IMPRESSION: 1. The likely benign focal asymmetry in the upper-outer left breast
is stable.

2.  No mammographic evidence of malignancy in the bilateral breasts.

RECOMMENDATION:
Diagnostic mammogram is suggested in 1 year. (Code:AV-5-K1C)

I have discussed the findings and recommendations with the patient.
If applicable, a reminder letter will be sent to the patient
regarding the next appointment.

BI-RADS CATEGORY  3: Probably benign.
